# Patient Record
Sex: Female | Born: 1985 | Hispanic: Yes | Marital: Single | Smoking: Never smoker
Health system: Southern US, Community
[De-identification: ages and names within clinical notes are randomized; demographics above are authoritative.]

## PROBLEM LIST (undated history)

## (undated) DIAGNOSIS — J45909 Unspecified asthma, uncomplicated: Secondary | ICD-10-CM

## (undated) DIAGNOSIS — T7840XA Allergy, unspecified, initial encounter: Secondary | ICD-10-CM

## (undated) HISTORY — DX: Unspecified asthma, uncomplicated: J45.909

## (undated) HISTORY — DX: Allergy, unspecified, initial encounter: T78.40XA

## (undated) HISTORY — PX: MOUTH SURGERY: SHX715

---

## 2013-11-22 ENCOUNTER — Ambulatory Visit: Payer: 59 | Admitting: Emergency Medicine

## 2013-11-22 VITALS — BP 110/68 | HR 75 | Temp 99.1°F | Resp 18 | Ht 65.5 in | Wt 208.2 lb

## 2013-11-22 DIAGNOSIS — J209 Acute bronchitis, unspecified: Secondary | ICD-10-CM

## 2013-11-22 MED ORDER — ALBUTEROL SULFATE HFA 108 (90 BASE) MCG/ACT IN AERS: 2.0000 | INHALATION_SPRAY | RESPIRATORY_TRACT | Status: DC | PRN

## 2013-11-22 NOTE — Patient Instructions (Signed)

## 2013-11-22 NOTE — Progress Notes (Signed)
[facility] [address], [place] [id number] [phone]  Date:  11/22/2013   Name:  [name]   DOB:  May 14, 1986   MRN:  [id number]  PCP:  No primary provider on file.    Chief Complaint: URI   History of Present Illness:  Christina Chambers is a 28 y.o. very pleasant female patient who presents with the following:  History of reactive airway disease and has not used an MDI since 2008.  Now to office with 1 week history of wheezing and cough.  Productive of mucopurulent sputum.  Short of breath with exertion and no fever or chills. No coryza.  No sore throat or nausea or vomiting.  No improvement with over the counter medications or other home remedies. Denies other complaint or health concern today.   There are no active problems to display for this patient.   Past Medical History  Diagnosis Date  . Allergy   . Asthma     History reviewed. No pertinent past surgical history.  History  Substance Use Topics  . Smoking status: Never Smoker   . Smokeless tobacco: Never Used  . Alcohol Use: Yes     Comment: occasional    History reviewed. No pertinent family history.  Allergies  Allergen Reactions  . Bactericin [Bacitracin] Itching    Medication list has been reviewed and updated.  No current outpatient prescriptions on file prior to visit.   No current facility-administered medications on file prior to visit.    Review of Systems:  As per HPI, otherwise negative.    Physical Examination: Filed Vitals:   11/22/13 2030  BP: 110/68  Pulse: 75  Temp: 99.1 F (37.3 C)  Resp: 18   Filed Vitals:   11/22/13 2030  Height: 5' 5.5" (1.664 m)  Weight: 208 lb 3.2 oz (94.439 kg)   Body mass index is 34.11 kg/(m^2). Ideal Body Weight: Weight in (lb) to have BMI = 25: 152.2  GEN: WDWN, NAD, Non-toxic, A & O x 3 HEENT: Atraumatic, Normocephalic. Neck supple. No masses, No LAD. Ears and Nose: No external deformity. CV: RRR, No M/G/R. No  JVD. No thrill. No extra heart sounds. PULM: CTA B, no wheezes, crackles, rhonchi. No retractions. No resp. distress. No accessory muscle use. ABD: S, NT, ND, +BS. No rebound. No HSM. EXTR: No c/c/e NEURO Normal gait.  PSYCH: Normally interactive. Conversant. Not depressed or anxious appearing.  Calm demeanor.    Assessment and Plan: Bronchitis with bronchospasm Albuterol mdi  Signed,  Phillips OdorJeffery Ellah Otte, MD

## 2013-11-23 ENCOUNTER — Emergency Department (HOSPITAL_COMMUNITY): Payer: 59

## 2013-11-23 ENCOUNTER — Emergency Department (HOSPITAL_COMMUNITY)
Admission: EM | Admit: 2013-11-23 | Discharge: 2013-11-23 | Disposition: A | Discharge status: Home / Self Care | Payer: 59 | Attending: Emergency Medicine | Admitting: Emergency Medicine

## 2013-11-23 ENCOUNTER — Encounter (HOSPITAL_COMMUNITY): Payer: Self-pay | Admitting: Emergency Medicine

## 2013-11-23 DIAGNOSIS — Z86711 Personal history of pulmonary embolism: Secondary | ICD-10-CM | POA: Insufficient documentation

## 2013-11-23 DIAGNOSIS — J45901 Unspecified asthma with (acute) exacerbation: Secondary | ICD-10-CM | POA: Insufficient documentation

## 2013-11-23 DIAGNOSIS — Z872 Personal history of diseases of the skin and subcutaneous tissue: Secondary | ICD-10-CM | POA: Insufficient documentation

## 2013-11-23 DIAGNOSIS — J40 Bronchitis, not specified as acute or chronic: Secondary | ICD-10-CM

## 2013-11-23 DIAGNOSIS — Z79899 Other long term (current) drug therapy: Secondary | ICD-10-CM | POA: Insufficient documentation

## 2013-11-23 DIAGNOSIS — Z86718 Personal history of other venous thrombosis and embolism: Secondary | ICD-10-CM | POA: Insufficient documentation

## 2013-11-23 DIAGNOSIS — J9801 Acute bronchospasm: Secondary | ICD-10-CM

## 2013-11-23 MED ORDER — PREDNISONE 20 MG PO TABS
60.0000 mg | ORAL_TABLET | Freq: Once | ORAL | Status: AC
  Administered 2013-11-23: 60 mg via ORAL
  Filled 2013-11-23: qty 3

## 2013-11-23 MED ORDER — ALBUTEROL SULFATE (2.5 MG/3ML) 0.083% IN NEBU: 5.0000 mg | INHALATION_SOLUTION | Freq: Once | RESPIRATORY_TRACT | Status: DC

## 2013-11-23 MED ORDER — IPRATROPIUM BROMIDE 0.02 % IN SOLN: 0.5000 mg | Freq: Once | RESPIRATORY_TRACT | Status: DC

## 2013-11-23 MED ORDER — PREDNISONE 10 MG PO TABS: 50.0000 mg | ORAL_TABLET | Freq: Every day | ORAL | Status: DC

## 2013-11-23 MED ORDER — HYDROCOD POLST-CHLORPHEN POLST 10-8 MG/5ML PO LQCR
5.0000 mL | Freq: Once | ORAL | Status: AC
  Administered 2013-11-23: 5 mL via ORAL
  Filled 2013-11-23: qty 5

## 2013-11-23 MED ORDER — HYDROCOD POLST-CHLORPHEN POLST 10-8 MG/5ML PO LQCR: 5.0000 mL | Freq: Two times a day (BID) | ORAL | Status: DC | PRN

## 2013-11-23 MED ORDER — IPRATROPIUM-ALBUTEROL 0.5-2.5 (3) MG/3ML IN SOLN
3.0000 mL | Freq: Once | RESPIRATORY_TRACT | Status: AC
  Administered 2013-11-23: 3 mL via RESPIRATORY_TRACT
  Filled 2013-11-23: qty 3

## 2013-11-23 MED ORDER — ALBUTEROL SULFATE (2.5 MG/3ML) 0.083% IN NEBU
2.5000 mg | INHALATION_SOLUTION | Freq: Once | RESPIRATORY_TRACT | Status: AC
  Administered 2013-11-23: 2.5 mg via RESPIRATORY_TRACT
  Filled 2013-11-23: qty 3

## 2013-11-23 NOTE — Discharge Instructions (Signed)

## 2013-11-23 NOTE — ED Notes (Signed)
Pt complains of a productive cough for three days, was seen at Urgent Care and received and inhaler, no relief

## 2013-11-23 NOTE — ED Provider Notes (Signed)
CSN: [id number]     Arrival date & time 11/23/13  0407 History   First MD Initiated Contact with Patient 11/23/13 [phone]     Chief Complaint  Patient presents with  . Cough      HPI Patient reports ongoing productive cough for the past several days.  She states her cough is worse when she lays flat.  She was seen earlier today at an urgent care was given an inhaler which she reports has not helped her cough.  No fevers or chills.  Mild shortness of breath.  No unilateral leg swelling.  History of DVT or pulmonary embolism.  She reports a history of eczema and reactive airway disease   Past Medical History  Diagnosis Date  . Allergy   . Asthma    History reviewed. No pertinent past surgical history. History reviewed. No pertinent family history. History  Substance Use Topics  . Smoking status: Never Smoker   . Smokeless tobacco: Never Used  . Alcohol Use: Yes     Comment: occasional   OB History   Grav Para Term Preterm Abortions TAB SAB Ect Mult Living                 Review of Systems  All other systems reviewed and are negative.      Allergies  Bactrim  Home Medications   Current Outpatient Rx  Name  Route  Sig  Dispense  Refill  . albuterol (PROVENTIL HFA;VENTOLIN HFA) 108 (90 BASE) MCG/ACT inhaler   Inhalation   Inhale 2 puffs into the lungs every 4 (four) hours as needed for wheezing or shortness of breath (cough, shortness of breath or wheezing.).   1 Inhaler   1   . Pseudoeph-Doxylamine-DM-APAP (NYQUIL PO)   Oral   Take 30 mLs by mouth 2 (two) times daily.         . chlorpheniramine-HYDROcodone (TUSSIONEX) 10-8 MG/5ML LQCR   Oral   Take 5 mLs by mouth every 12 (twelve) hours as needed for cough.   30 mL   0   . predniSONE (DELTASONE) 10 MG tablet   Oral   Take 5 tablets (50 mg total) by mouth daily.   20 tablet   0    BP 111/55  Pulse 71  Temp(Src) 99 F (37.2 C) (Oral)  Resp 22  Ht 5\' 6"  (1.676 m)  Wt 208 lb (94.348 kg)  BMI 33.59  kg/m2  SpO2 98%  LMP 11/01/2013 Physical Exam  Nursing note and vitals reviewed. Constitutional: She is oriented to person, place, and time. She appears well-developed and well-nourished. No distress.  HENT:  Head: Normocephalic and atraumatic.  Eyes: EOM are normal.  Neck: Normal range of motion.  Cardiovascular: Normal rate.   Pulmonary/Chest: Effort normal and breath sounds normal. No respiratory distress. She has no wheezes.  Abdominal: Soft.  Musculoskeletal: Normal range of motion.  Neurological: She is alert and oriented to person, place, and time.  Skin: Skin is warm and dry.  Psychiatric: She has a normal mood and affect. Judgment normal.    ED Course  Procedures (including critical care time) Labs Review Labs Reviewed - No data to display Imaging Review Dg Chest 2 View  11/23/2013   CLINICAL DATA:  Cough, congestion  EXAM: CHEST  2 VIEW  COMPARISON:  None.  FINDINGS: The heart size and mediastinal contours are within normal limits. Both lungs are clear. No pleural effusion or pneumothorax. The visualized skeletal structures are unremarkable.  IMPRESSION: No  radiographic evidence of active cardiopulmonary disease.   Electronically Signed   By: Jearld Lesch M.D.   On: 11/23/2013 05:58  I personally reviewed the imaging tests through PACS system I reviewed available ER/hospitalization records through the EMR    EKG Interpretation None      MDM   Final diagnoses:  Bronchitis  Bronchospasm    Suspect bronchospasm and associated bronchitis. cxr pending. Albuterol and atrovent. Will dose with prednisone. Doubt PE. PERC negative. Well appearing. No hypoxia.   6:25 AM Pt feels better. No hypoxia. No increased work of breathing. Dc home in good condition    Lyanne Co, MD 11/23/13 [phone]

## 2015-03-05 ENCOUNTER — Emergency Department (HOSPITAL_COMMUNITY)
Admission: EM | Admit: 2015-03-05 | Discharge: 2015-03-05 | Disposition: A | Discharge status: Home / Self Care | Payer: 59 | Source: Home / Self Care | Attending: Emergency Medicine | Admitting: Emergency Medicine

## 2015-03-05 ENCOUNTER — Encounter (HOSPITAL_COMMUNITY): Payer: Self-pay

## 2015-03-05 DIAGNOSIS — W57XXXA Bitten or stung by nonvenomous insect and other nonvenomous arthropods, initial encounter: Secondary | ICD-10-CM

## 2015-03-05 DIAGNOSIS — S30861A Insect bite (nonvenomous) of abdominal wall, initial encounter: Secondary | ICD-10-CM | POA: Diagnosis not present

## 2015-03-05 NOTE — Discharge Instructions (Signed)
We removed a small piece of the tick today. If you develop fevers, headache, rash, or the site of the tick bite turns red and swollen, please come back.

## 2015-03-05 NOTE — ED Provider Notes (Signed)
CSN: [id number]     Arrival date & time 03/05/15  1846 History   First MD Initiated Contact with Patient 03/05/15 1908     Chief Complaint  Patient presents with  . Skin Problem   (Consider location/radiation/quality/duration/timing/severity/associated sxs/prior Treatment) HPI  She is a 29 year old woman here for evaluation of tick bite. She states she was camping this weekend, and noticed a tick on her abdomen a few hours ago. Her friend pulled it off, but she is concerned that there is part of the tick still there. No rashes. No fever.  Past Medical History  Diagnosis Date  . Allergy   . Asthma    History reviewed. No pertinent past surgical history. History reviewed. No pertinent family history. History  Substance Use Topics  . Smoking status: Never Smoker   . Smokeless tobacco: Never Used  . Alcohol Use: Yes     Comment: occasional   OB History    No data available     Review of Systems As in history of present illness Allergies  Bactrim  Home Medications   Prior to Admission medications   Not on File   BP 120/80 mmHg  Pulse 62  Temp(Src) 97.8 F (36.6 C) (Oral)  Resp 12  SpO2 98% Physical Exam  Constitutional: She is oriented to person, place, and time. She appears well-developed. No distress.  Cardiovascular: Normal rate.   Pulmonary/Chest: Effort normal.  Neurological: She is alert and oriented to person, place, and time.  Skin:  Abrasion on right lower quadrant of abdomen with central foreign body.    ED Course  FOREIGN BODY REMOVAL Date/Time: 03/05/2015 7:37 PM Performed by: Charm Rings Authorized by: Charm Rings Consent: Verbal consent obtained. Risks and benefits: risks, benefits and alternatives were discussed Consent given by: patient Patient understanding: patient states understanding of the procedure being performed Patient identity confirmed: verbally with patient Time out: Immediately prior to procedure a "time out" was called to  verify the correct patient, procedure, equipment, support staff and site/side marked as required. Body area: skin General location: trunk Location details: abdomen Anesthesia: local infiltration Local anesthetic: lidocaine 2% without epinephrine Anesthetic total: 0.5 ml Localization method: magnification Removal mechanism: scalpel Depth: subcutaneous Complexity: simple 1 objects recovered. Objects recovered: tick manible Post-procedure assessment: foreign body removed Patient tolerance: Patient tolerated the procedure well with no immediate complications   (including critical care time) Labs Review Labs Reviewed - No data to display  Imaging Review No results found.   MDM   1. Tick bite of abdomen, initial encounter    Tick remnant removed. Discussed wound care. Return to percussion reviewed including development of fever, headache, rash, or local reaction.    Charm Rings, MD 03/05/15 [phone]

## 2015-03-05 NOTE — ED Notes (Signed)
Concerned a piece of tick broke off when she removed it from her abdominal area earlier today

## 2017-03-27 ENCOUNTER — Emergency Department (HOSPITAL_COMMUNITY): Payer: 59

## 2017-03-27 ENCOUNTER — Encounter (HOSPITAL_COMMUNITY): Payer: Self-pay | Admitting: Emergency Medicine

## 2017-03-27 ENCOUNTER — Emergency Department (HOSPITAL_COMMUNITY)
Admission: EM | Admit: 2017-03-27 | Discharge: 2017-03-27 | Disposition: A | Discharge status: Home / Self Care | Payer: 59 | Attending: Emergency Medicine | Admitting: Emergency Medicine

## 2017-03-27 DIAGNOSIS — R0789 Other chest pain: Secondary | ICD-10-CM | POA: Diagnosis not present

## 2017-03-27 DIAGNOSIS — M25552 Pain in left hip: Secondary | ICD-10-CM | POA: Diagnosis not present

## 2017-03-27 DIAGNOSIS — Y929 Unspecified place or not applicable: Secondary | ICD-10-CM | POA: Diagnosis not present

## 2017-03-27 DIAGNOSIS — M79632 Pain in left forearm: Secondary | ICD-10-CM | POA: Diagnosis not present

## 2017-03-27 DIAGNOSIS — T07XXXA Unspecified multiple injuries, initial encounter: Secondary | ICD-10-CM | POA: Diagnosis present

## 2017-03-27 DIAGNOSIS — M25532 Pain in left wrist: Secondary | ICD-10-CM | POA: Insufficient documentation

## 2017-03-27 DIAGNOSIS — Y999 Unspecified external cause status: Secondary | ICD-10-CM | POA: Insufficient documentation

## 2017-03-27 DIAGNOSIS — Y939 Activity, unspecified: Secondary | ICD-10-CM | POA: Insufficient documentation

## 2017-03-27 DIAGNOSIS — S199XXA Unspecified injury of neck, initial encounter: Secondary | ICD-10-CM | POA: Diagnosis not present

## 2017-03-27 DIAGNOSIS — M25562 Pain in left knee: Secondary | ICD-10-CM | POA: Insufficient documentation

## 2017-03-27 MED ORDER — ACETAMINOPHEN 500 MG PO TABS: 500.0000 mg | ORAL_TABLET | Freq: Four times a day (QID) | ORAL | 0 refills | Status: AC | PRN

## 2017-03-27 MED ORDER — IBUPROFEN 600 MG PO TABS: 600.0000 mg | ORAL_TABLET | Freq: Four times a day (QID) | ORAL | 0 refills | Status: AC | PRN

## 2017-03-27 MED ORDER — METHOCARBAMOL 500 MG PO TABS: 500.0000 mg | ORAL_TABLET | Freq: Two times a day (BID) | ORAL | 0 refills | Status: DC

## 2017-03-27 MED ORDER — IBUPROFEN 200 MG PO TABS
600.0000 mg | ORAL_TABLET | Freq: Once | ORAL | Status: AC
  Administered 2017-03-27: 600 mg via ORAL
  Filled 2017-03-27: qty 3
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
## 2017-12-22 ENCOUNTER — Encounter (HOSPITAL_COMMUNITY): Payer: Self-pay

## 2017-12-22 ENCOUNTER — Other Ambulatory Visit: Payer: Self-pay

## 2017-12-22 ENCOUNTER — Emergency Department (HOSPITAL_COMMUNITY)
Admission: EM | Admit: 2017-12-22 | Discharge: 2017-12-22 | Disposition: A | Discharge status: Home / Self Care | Payer: 59 | Attending: Emergency Medicine | Admitting: Emergency Medicine

## 2017-12-22 DIAGNOSIS — J45909 Unspecified asthma, uncomplicated: Secondary | ICD-10-CM | POA: Insufficient documentation

## 2017-12-22 DIAGNOSIS — Y9362 Activity, american flag or touch football: Secondary | ICD-10-CM | POA: Diagnosis not present

## 2017-12-22 DIAGNOSIS — Y999 Unspecified external cause status: Secondary | ICD-10-CM | POA: Diagnosis not present

## 2017-12-22 DIAGNOSIS — X58XXXA Exposure to other specified factors, initial encounter: Secondary | ICD-10-CM | POA: Diagnosis not present

## 2017-12-22 DIAGNOSIS — Y929 Unspecified place or not applicable: Secondary | ICD-10-CM | POA: Insufficient documentation

## 2017-12-22 DIAGNOSIS — M79605 Pain in left leg: Secondary | ICD-10-CM | POA: Diagnosis present

## 2017-12-22 MED ORDER — ENOXAPARIN SODIUM 100 MG/ML ~~LOC~~ SOLN
1.0000 mg/kg | Freq: Once | SUBCUTANEOUS | Status: AC
  Administered 2017-12-22: 95 mg via SUBCUTANEOUS
  Filled 2017-12-22: qty 0.95

## 2017-12-22 MED ORDER — METHOCARBAMOL 500 MG PO TABS: 1000.0000 mg | ORAL_TABLET | Freq: Four times a day (QID) | ORAL | 0 refills | Status: DC | PRN

## 2017-12-22 NOTE — Discharge Instructions (Addendum)
For pain control please take ibuprofen (also known as Motrin or Advil) 800mg  (this is normally 4 over the counter pills) 3 times a day  for 5 days. Take with food to minimize stomach irritation.  For breakthrough pain you may take Robaxin. Do not drink alcohol, drive or operate heavy machinery when taking Robaxin.  [facility], ask for the vascular imaging suite.  They will perform the ultrasound at that time, if it is negative then will send you home, if not they will send you to the emergency department.

## 2017-12-22 NOTE — ED Provider Notes (Signed)
[facility]-EMERGENCY DEPT Provider Note   CSN: [id number] Arrival date & time: 12/22/17  1619     History   Chief Complaint Chief Complaint  Patient presents with  . left leg pain    HPI   Blood pressure 116/72, pulse 70, temperature 98.4 F (36.9 C), temperature source Oral, resp. rate 17, height 5\' 6"  (1.676 m), weight 93.9 kg (207 lb), last menstrual period 12/08/2017, SpO2 100 %.  Christina Chambers is a 32 y.o. female complaining of pain to posterior left thigh occurring 2 weeks ago while she was in flag football.  Been resting the area and she went to play football again today and the pain is severe, she cannot walk on it.  She does not take any exogenous estrogen, no history of DVT or PE.  States that the pain is severe, 7 out of 10 no pain medications taken prior to arrival.  Past Medical History:  Diagnosis Date  . Allergy   . Asthma     There are no active problems to display for this patient.   Past Surgical History:  Procedure Laterality Date  . MOUTH SURGERY       OB History   None      Home Medications    Prior to Admission medications   Medication Sig Start Date End Date Taking? Authorizing Provider  acetaminophen (TYLENOL) 500 MG tablet Take 1 tablet (500 mg total) by mouth every 6 (six) hours as needed. 03/27/17   Law, Waylan BogaAlexandra M, PA-C  ibuprofen (ADVIL,MOTRIN) 600 MG tablet Take 1 tablet (600 mg total) by mouth every 6 (six) hours as needed. 03/27/17   Law, Waylan BogaAlexandra M, PA-C  methocarbamol (ROBAXIN) 500 MG tablet Take 2 tablets (1,000 mg total) by mouth 4 (four) times daily as needed (Pain). 12/22/17   Ganon Demasi, Mardella LaymanNicole, PA-C    Family History No family history on file.  Social History Social History   Tobacco Use  . Smoking status: Never Smoker  . Smokeless tobacco: Never Used  Substance Use Topics  . Alcohol use: Yes    Comment: occasional  . Drug use: No     Allergies   Bactrim  [sulfamethoxazole-trimethoprim]   Review of Systems Review of Systems  A complete review of systems was obtained and all systems are negative except as noted in the HPI and PMH.   Physical Exam Updated Vital Signs BP 116/72 (BP Location: Left Arm)   Pulse 70   Temp 98.4 F (36.9 C) (Oral)   Resp 17   Ht 5\' 6"  (1.676 m)   Wt 93.9 kg (207 lb)   LMP 12/08/2017   SpO2 100%   BMI 33.41 kg/m   Physical Exam  Constitutional: She is oriented to person, place, and time. She appears well-developed and well-nourished. No distress.  HENT:  Head: Normocephalic.  Mouth/Throat: Oropharynx is clear and moist.  Eyes: Conjunctivae are normal.  Neck: Normal range of motion. No JVD present. No tracheal deviation present.  Cardiovascular: Normal rate, regular rhythm and intact distal pulses.  Radial pulse equal bilaterally  Pulmonary/Chest: Effort normal and breath sounds normal. No stridor. No respiratory distress. She has no wheezes. She has no rales. She exhibits no tenderness.  Abdominal: Soft. She exhibits no distension and no mass. There is no tenderness. There is no rebound and no guarding.  Musculoskeletal: Normal range of motion. She exhibits tenderness. She exhibits no edema.       Legs: No calf asymmetry, superficial collaterals, palpable cords, edema,  Homans sign negative bilaterally.    Neurological: She is alert and oriented to person, place, and time.  Skin: Skin is warm. She is not diaphoretic.  Psychiatric: She has a normal mood and affect.  Nursing note and vitals reviewed.    ED Treatments / Results  Labs (all labs ordered are listed, but only abnormal results are displayed) Labs Reviewed - No data to display  EKG None  Radiology No results found.  Procedures Procedures (including critical care time)  Medications Ordered in ED Medications  enoxaparin (LOVENOX) injection 95 mg (95 mg Subcutaneous Given 12/22/17 2122)     Initial Impression / Assessment and  Plan / ED Course  I have reviewed the triage vital signs and the nursing notes.  Pertinent labs & imaging results that were available during my care of the patient were reviewed by me and considered in my medical decision making (see chart for details).     Vitals:   12/22/17 1638 12/22/17 1642 12/22/17 2124  BP: 112/68  116/72  Pulse: 82  70  Resp: 16  17  Temp: 98.4 F (36.9 C)    TempSrc: Oral    SpO2: 96%  100%  Weight:  93.9 kg (207 lb)   Height:  5\' 6"  (1.676 m)     Medications  enoxaparin (LOVENOX) injection 95 mg (95 mg Subcutaneous Given 12/22/17 2122)    [name] is 32 y.o. female presenting with left posterior thigh pain.  It occurred after she was playing flag football, she has not been very active in the last several weeks.  She has pain recurring in the same area.  Likely musculoskeletal strain however, will evaluate for DVT.  Patient is treated with Lovenox tonight, she is advised to return to vascular imaging suite for evaluation tomorrow.  Patient given crutches and muscle relaxer as needed referral to Ortho.  Evaluation does not show pathology that would require ongoing emergent intervention or inpatient treatment. Pt is hemodynamically stable and mentating appropriately. Discussed findings and plan with patient/guardian, who agrees with care plan. All questions answered. Return precautions discussed and outpatient follow up given.     Final Clinical Impressions(s) / ED Diagnoses   Final diagnoses:  Left leg pain    ED Discharge Orders        Ordered    methocarbamol (ROBAXIN) 500 MG tablet  4 times daily PRN     12/22/17 2106    VAS [place] LOWER EXTREMITY VENOUS (DVT)     12/22/17 2106       Hannan Hutmacher, Mardella Layman 12/22/17 2352    Benjiman Core, MD 12/25/17 1148

## 2017-12-22 NOTE — ED Triage Notes (Signed)
Patient was playing flag football yesterday and felt pain in her left leg at 1830. patient reports pain is worse with weight bearing and movement. Patient better when standing still.

## 2017-12-23 ENCOUNTER — Ambulatory Visit (HOSPITAL_COMMUNITY): Admission: RE | Admit: 2017-12-23 | Payer: 59 | Source: Ambulatory Visit

## 2017-12-23 ENCOUNTER — Ambulatory Visit (HOSPITAL_COMMUNITY)
Admission: RE | Admit: 2017-12-23 | Discharge: 2017-12-23 | Disposition: A | Discharge status: Home / Self Care | Payer: 59 | Source: Ambulatory Visit | Attending: Emergency Medicine | Admitting: Emergency Medicine

## 2017-12-23 DIAGNOSIS — M79609 Pain in unspecified limb: Secondary | ICD-10-CM | POA: Diagnosis not present

## 2017-12-23 DIAGNOSIS — M79605 Pain in left leg: Secondary | ICD-10-CM | POA: Diagnosis not present

## 2017-12-23 NOTE — Progress Notes (Signed)
LLE venous duplex prelim: negative for DVT. Azora Bonzo Eunice, RDMS, RVT  

## 2018-02-16 ENCOUNTER — Ambulatory Visit: Payer: Self-pay | Admitting: Physician Assistant
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
## 2020-05-15 ENCOUNTER — Other Ambulatory Visit: Payer: Self-pay

## 2020-05-15 ENCOUNTER — Ambulatory Visit (HOSPITAL_COMMUNITY)
Admission: EM | Admit: 2020-05-15 | Discharge: 2020-05-15 | Disposition: A | Discharge status: Home / Self Care | Payer: 59

## 2020-05-15 DIAGNOSIS — F411 Generalized anxiety disorder: Secondary | ICD-10-CM | POA: Diagnosis not present

## 2020-05-15 NOTE — ED Provider Notes (Signed)
Behavioral Health Medical Screening Exam  Christina Chambers is a 34 y.o. female presents to [place] [facility] seeking additional outpatient resources for worsening anxiety and depression.  States she was referred to "change however the appointment date is mid October.  She reports struggling with worsening anxiety and racing thoughts.  She denies suicidal or homicidal ideations.  Denies auditory or visual hallucinations.  No previous inpatient is admissions.  Denies history of suicide attempts.  Reports she is currently followed by therapist Elita Boone at [facility].  Patient seeking medications to help with anxiety.  Additional outpatient resources will be provided.  Support, encouragement  And reassurance was provided.  Total Time spent with patient: 15 minutes  Psychiatric Specialty Exam  Presentation  General Appearance:Appropriate for Environment  Eye Contact:No data recorded Speech:Clear and Coherent  Speech Volume:Normal  Handedness:No data recorded  Mood and Affect  Mood:Anxious;Depressed  Affect:Congruent   Thought Process  Thought Processes:Coherent  Descriptions of Associations:Intact  Orientation:Full (Time, Place and Person)  Thought Content:No data recorded Hallucinations:No data recorded Ideas of Reference:No data recorded Suicidal Thoughts:No  Homicidal Thoughts:No   Sensorium  Memory:Immediate Good  Judgment:Good  Insight:Good   Executive Functions  Concentration:Good  Attention Span:Good  Recall:Good  Fund of Knowledge:Fair  Language:Good   Psychomotor Activity  Psychomotor Activity:No data recorded  Assets  Assets:No data recorded  Sleep  Sleep:No data recorded Number of hours: No data recorded  Physical Exam: Physical Exam Vitals reviewed.  HENT:     Head: Normocephalic.  Neurological:     Mental Status: She is alert.  Psychiatric:        Mood and Affect: Mood normal.        Thought Content:  Thought content normal.    Review of Systems  Psychiatric/Behavioral: Positive for depression. Negative for suicidal ideas. The patient is nervous/anxious.   All other systems reviewed and are negative.  Blood pressure 121/68, pulse 75, temperature 98.4 F (36.9 C), temperature source Oral, resp. rate 18, height 5\' 5"  (1.651 m), weight 219 lb (99.3 kg), SpO2 100 %. Body mass index is 36.44 kg/m.  Musculoskeletal: Strength & Muscle Tone: within normal limits Gait & Station: normal Patient leans: N/A   Recommendations: TTS to provided additional outpatient resources for the mood treatment center Based on my evaluation the patient does not appear to have an emergency medical condition.  , NP 05/15/2020, 4:53 PM

## 2020-05-15 NOTE — ED Notes (Signed)
Patient discharged home. AVS reviewed with patient and written copy given. Patient escorted to lobby

## 2020-05-15 NOTE — Discharge Instructions (Addendum)
Take all medications as prescribed. Keep all follow-up appointments as scheduled.  Do not consume alcohol or use illegal drugs while on prescription medications. Report any adverse effects from your medications to your primary care provider promptly.  In the event of recurrent symptoms or worsening symptoms, call 911, a crisis hotline, or go to the nearest emergency department for evaluation.   
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
## 2020-05-15 NOTE — ED Notes (Signed)
Belongings in locker 31 

## 2020-05-20 ENCOUNTER — Ambulatory Visit (HOSPITAL_COMMUNITY)
Admission: EM | Admit: 2020-05-20 | Discharge: 2020-05-20 | Disposition: A | Discharge status: Home / Self Care | Payer: 59

## 2020-05-20 ENCOUNTER — Other Ambulatory Visit: Payer: Self-pay

## 2020-05-20 ENCOUNTER — Ambulatory Visit (HOSPITAL_COMMUNITY): Payer: Self-pay

## 2020-05-20 ENCOUNTER — Encounter (HOSPITAL_COMMUNITY): Payer: Self-pay | Admitting: Family Medicine

## 2020-05-20 DIAGNOSIS — F419 Anxiety disorder, unspecified: Secondary | ICD-10-CM

## 2020-05-20 MED ORDER — HYDROXYZINE HCL 25 MG PO TABS: 25.0000 mg | ORAL_TABLET | Freq: Four times a day (QID) | ORAL | 1 refills | Status: AC

## 2020-05-20 MED ORDER — ESCITALOPRAM OXALATE 10 MG PO TABS: 10.0000 mg | ORAL_TABLET | Freq: Every day | ORAL | 1 refills | Status: AC

## 2020-05-20 NOTE — ED Provider Notes (Signed)
[facility]    CSN: [id number] Arrival date & time: 05/20/20  1439      History   Chief Complaint Chief Complaint  Patient presents with  . Palpitations  . Anxiety    HPI Christina Chambers is a 34 y.o. female.   Patient is a 34 year old female who presents today with worsening anxiety.  This is been ongoing issue for the past 3 months and she is unable to control it as she has in the past with breathing exercises and other measures.  Was seen at behavioral health urgent care and referred her told to follow-up with psychiatrist.  Unable to get in with them for another month.  Also has scheduled appointment to see OB/GYN at the end of this month.  She is been under tremendous amount of stress over the past couple months and feels like she may be medicine to help at this point.  She has panic attacks where her heart races she becomes short of breath, numbness and tingling and is lasting for prolonged period of time.  Denies any suicidal or homicidal ideations.     Past Medical History:  Diagnosis Date  . Allergy   . Asthma     There are no problems to display for this patient.   Past Surgical History:  Procedure Laterality Date  . MOUTH SURGERY      OB History   No obstetric history on file.      Home Medications    Prior to Admission medications   Medication Sig Start Date End Date Taking? Authorizing Provider  acetaminophen (TYLENOL) 500 MG tablet Take 1 tablet (500 mg total) by mouth every 6 (six) hours as needed. 03/27/17  Yes Law, Waylan Boga, PA-C  albuterol (VENTOLIN HFA) 108 (90 Base) MCG/ACT inhaler Inhale 2 puffs into the lungs as needed.   Yes [provider]  ibuprofen (ADVIL,MOTRIN) 600 MG tablet Take 1 tablet (600 mg total) by mouth every 6 (six) hours as needed. 03/27/17  Yes Law, Alexandra M, PA-C  VIVELLE-DOT 0.1 MG/24HR patch Place 1 patch onto the skin 2 (two) times a week. 05/10/20  Yes [provider]  escitalopram (LEXAPRO)  10 MG tablet Take 1 tablet (10 mg total) by mouth daily. 05/20/20   Dahlia Byes A, NP  hydrOXYzine (ATARAX/VISTARIL) 25 MG tablet Take 1 tablet (25 mg total) by mouth every 6 (six) hours. 05/20/20   Janace Aris, NP    Family History History reviewed. No pertinent family history.  Social History Social History   Tobacco Use  . Smoking status: Never Smoker  . Smokeless tobacco: Never Used  Vaping Use  . Vaping Use: Never used  Substance Use Topics  . Alcohol use: Yes    Comment: occasional  . Drug use: No     Allergies   Bactrim [sulfamethoxazole-trimethoprim]   Review of Systems Review of Systems   Physical Exam Triage Vital Signs ED Triage Vitals  Enc Vitals Group     BP 05/20/20 1510 115/72     Pulse Rate 05/20/20 1510 74     Resp 05/20/20 1510 16     Temp 05/20/20 1510 98.7 F (37.1 C)     Temp Source 05/20/20 1510 Oral     SpO2 05/20/20 1510 99 %     Weight 05/20/20 1524 215 lb (97.5 kg)     Height 05/20/20 1524 5\' 6"  (1.676 m)     Head Circumference --      Peak Flow --  Pain Score 05/20/20 1524 0     Pain Loc --      Pain Edu? --      Excl. in GC? --    No data found.  Updated Vital Signs BP 115/72 (BP Location: Right Arm)   Pulse 74   Temp 98.7 F (37.1 C) (Oral)   Resp 16   Ht 5\' 6"  (1.676 m)   Wt 215 lb (97.5 kg)   LMP 04/29/2020   SpO2 99%   BMI 34.70 kg/m   Visual Acuity Right Eye Distance:   Left Eye Distance:   Bilateral Distance:    Right Eye Near:   Left Eye Near:    Bilateral Near:     Physical Exam Vitals and nursing note reviewed.  Constitutional:      General: She is not in acute distress.    Appearance: Normal appearance. She is not ill-appearing, toxic-appearing or diaphoretic.  HENT:     Head: Normocephalic.     Nose: Nose normal.  Eyes:     Conjunctiva/sclera: Conjunctivae normal.  Cardiovascular:     Rate and Rhythm: Normal rate and regular rhythm.     Pulses: Normal pulses.     Heart sounds: Normal heart  sounds.  Pulmonary:     Effort: Pulmonary effort is normal.     Breath sounds: Normal breath sounds.  Musculoskeletal:        General: Normal range of motion.     Cervical back: Normal range of motion.  Skin:    General: Skin is warm and dry.     Findings: No rash.  Neurological:     Mental Status: She is alert.  Psychiatric:        Mood and Affect: Mood normal.      UC Treatments / Results  Labs (all labs ordered are listed, but only abnormal results are displayed) Labs Reviewed - No data to display  EKG   Radiology No results found.  Procedures Procedures (including critical care time)  Medications Ordered in UC Medications - No data to display  Initial Impression / Assessment and Plan / UC Course  I have reviewed the triage vital signs and the nursing notes.  Pertinent labs & imaging results that were available during my care of the patient were reviewed by me and considered in my medical decision making (see chart for details).     Anxiety- patient has reached out for help many times without any success.  This has been ongoing issue for 3 months or more and is worsening and impacting her life.  She has no suicidal or homicidal ideations. She is clear minded today. No chest pain, SOB.  We will go ahead and start her on Lexapro daily for anxiety and depression.  Hydroxyzine as needed for acute anxiety and panic.  Patient has multiple appointments scheduled for follow-up Instructions given on how to take the medicine and that it could take a few weeks before the Lexapro truly starts to work.  Patient understanding and agreed to plan. Final Clinical Impressions(s) / UC Diagnoses   Final diagnoses:  Anxiety     Discharge Instructions     Take the medication as prescribed.  Lexapro daily for anxiety and depression. As we spoke about it can take a few weeks before the Lexapro starts to work. Hydroxyzine as needed.  Follow-up with as planned with specialist [facility]  here if needed    ED Prescriptions    Medication Sig Dispense Auth. Provider  escitalopram (LEXAPRO) 10 MG tablet Take 1 tablet (10 mg total) by mouth daily. 30 tablet Zaccary Creech A, NP   hydrOXYzine (ATARAX/VISTARIL) 25 MG tablet Take 1 tablet (25 mg total) by mouth every 6 (six) hours. 30 tablet Dahlia Byes A, NP     PDMP not reviewed this encounter.   Dahlia Byes A, NP 05/20/20 [phone]

## 2020-05-20 NOTE — ED Triage Notes (Signed)
Pt presents with c/o possible intermittent heart palpitations, chest tightness and stomach pain. Pt also reports her hands get shaky during these episodes. Pt also reports left eyelid twitching. Pt states symptoms present for several months. Pt states she has been diagnosed with anxiety in the past but has not ever been on medications. Pt does not have a PCP.

## 2020-05-20 NOTE — Discharge Instructions (Addendum)
Take the medication as prescribed.  Lexapro daily for anxiety and depression. As we spoke about it can take a few weeks before the Lexapro starts to work. Hydroxyzine as needed.  Follow-up with as planned with specialist [facility] here if needed

## 2021-02-27 ENCOUNTER — Ambulatory Visit: Payer: 59 | Admitting: Physician Assistant
